# Patient Record
Sex: Female | Born: 1970 | Race: White | Hispanic: Yes | Marital: Married | Smoking: Never smoker
Health system: Southern US, Community
[De-identification: ages and names within clinical notes are randomized; demographics above are authoritative.]

## PROBLEM LIST (undated history)

## (undated) DIAGNOSIS — T7840XA Allergy, unspecified, initial encounter: Secondary | ICD-10-CM

## (undated) DIAGNOSIS — H9313 Tinnitus, bilateral: Secondary | ICD-10-CM

## (undated) DIAGNOSIS — I10 Essential (primary) hypertension: Secondary | ICD-10-CM

## (undated) DIAGNOSIS — K219 Gastro-esophageal reflux disease without esophagitis: Secondary | ICD-10-CM

## (undated) DIAGNOSIS — K429 Umbilical hernia without obstruction or gangrene: Secondary | ICD-10-CM

## (undated) HISTORY — DX: Essential (primary) hypertension: I10

## (undated) HISTORY — DX: Gastro-esophageal reflux disease without esophagitis: K21.9

## (undated) HISTORY — DX: Allergy, unspecified, initial encounter: T78.40XA

---

## 1999-03-05 ENCOUNTER — Ambulatory Visit (HOSPITAL_COMMUNITY): Admission: RE | Admit: 1999-03-05 | Discharge: 1999-03-05 | Payer: Self-pay | Admitting: Neurosurgery

## 1999-03-05 ENCOUNTER — Encounter: Payer: Self-pay | Admitting: Neurosurgery

## 1999-10-15 ENCOUNTER — Other Ambulatory Visit: Admission: RE | Admit: 1999-10-15 | Discharge: 1999-10-15 | Payer: Self-pay | Admitting: Obstetrics and Gynecology

## 2000-11-02 ENCOUNTER — Other Ambulatory Visit: Admission: RE | Admit: 2000-11-02 | Discharge: 2000-11-02 | Payer: Self-pay | Admitting: Obstetrics and Gynecology

## 2001-11-13 ENCOUNTER — Other Ambulatory Visit: Admission: RE | Admit: 2001-11-13 | Discharge: 2001-11-13 | Payer: Self-pay | Admitting: Obstetrics and Gynecology

## 2003-12-27 ENCOUNTER — Other Ambulatory Visit: Admission: RE | Admit: 2003-12-27 | Discharge: 2003-12-27 | Payer: Self-pay | Admitting: Obstetrics and Gynecology

## 2004-01-10 ENCOUNTER — Encounter: Admission: RE | Admit: 2004-01-10 | Discharge: 2004-01-10 | Payer: Self-pay | Admitting: Obstetrics and Gynecology

## 2005-02-02 ENCOUNTER — Other Ambulatory Visit: Admission: RE | Admit: 2005-02-02 | Discharge: 2005-02-02 | Payer: Self-pay | Admitting: Obstetrics and Gynecology

## 2016-01-28 DIAGNOSIS — M47812 Spondylosis without myelopathy or radiculopathy, cervical region: Secondary | ICD-10-CM | POA: Diagnosis not present

## 2016-01-28 DIAGNOSIS — M542 Cervicalgia: Secondary | ICD-10-CM | POA: Diagnosis not present

## 2016-10-28 DIAGNOSIS — N926 Irregular menstruation, unspecified: Secondary | ICD-10-CM | POA: Diagnosis not present

## 2016-10-28 DIAGNOSIS — Z3200 Encounter for pregnancy test, result unknown: Secondary | ICD-10-CM | POA: Diagnosis not present

## 2016-10-28 DIAGNOSIS — N912 Amenorrhea, unspecified: Secondary | ICD-10-CM | POA: Diagnosis not present

## 2016-11-01 DIAGNOSIS — N912 Amenorrhea, unspecified: Secondary | ICD-10-CM | POA: Diagnosis not present

## 2016-11-08 DIAGNOSIS — N911 Secondary amenorrhea: Secondary | ICD-10-CM | POA: Diagnosis not present

## 2016-11-08 DIAGNOSIS — O039 Complete or unspecified spontaneous abortion without complication: Secondary | ICD-10-CM | POA: Diagnosis not present

## 2017-11-17 DIAGNOSIS — N912 Amenorrhea, unspecified: Secondary | ICD-10-CM | POA: Diagnosis not present

## 2017-11-21 DIAGNOSIS — N912 Amenorrhea, unspecified: Secondary | ICD-10-CM | POA: Diagnosis not present

## 2018-09-03 ENCOUNTER — Emergency Department (HOSPITAL_COMMUNITY)
Admission: EM | Admit: 2018-09-03 | Discharge: 2018-09-03 | Discharge status: Against Medical Advice | Payer: 59 | Attending: Emergency Medicine | Admitting: Emergency Medicine

## 2018-09-03 ENCOUNTER — Emergency Department (HOSPITAL_COMMUNITY): Payer: 59

## 2018-09-03 ENCOUNTER — Other Ambulatory Visit: Payer: Self-pay

## 2018-09-03 DIAGNOSIS — M542 Cervicalgia: Secondary | ICD-10-CM | POA: Diagnosis not present

## 2018-09-03 DIAGNOSIS — Z5321 Procedure and treatment not carried out due to patient leaving prior to being seen by health care provider: Secondary | ICD-10-CM | POA: Insufficient documentation

## 2018-09-03 NOTE — ED Notes (Signed)
Patient c-collared in triage

## 2018-09-03 NOTE — ED Triage Notes (Signed)
Patient called and no answer. Unable to locate in the department.

## 2018-09-03 NOTE — ED Triage Notes (Signed)
Patient states that she fell out of bed from a sitting position about 2 hours ago and injured neck. States that pain is getting increasingly worse and she "cannot move" her neck.

## 2019-01-22 DIAGNOSIS — Z03818 Encounter for observation for suspected exposure to other biological agents ruled out: Secondary | ICD-10-CM | POA: Diagnosis not present

## 2019-05-22 DIAGNOSIS — H9312 Tinnitus, left ear: Secondary | ICD-10-CM | POA: Diagnosis not present

## 2019-05-22 DIAGNOSIS — H903 Sensorineural hearing loss, bilateral: Secondary | ICD-10-CM | POA: Diagnosis not present

## 2019-08-01 DIAGNOSIS — J3489 Other specified disorders of nose and nasal sinuses: Secondary | ICD-10-CM | POA: Diagnosis not present

## 2019-08-01 DIAGNOSIS — Z20828 Contact with and (suspected) exposure to other viral communicable diseases: Secondary | ICD-10-CM | POA: Diagnosis not present

## 2019-11-19 ENCOUNTER — Other Ambulatory Visit: Payer: Self-pay | Admitting: General Surgery

## 2019-11-19 DIAGNOSIS — K439 Ventral hernia without obstruction or gangrene: Secondary | ICD-10-CM

## 2019-11-19 DIAGNOSIS — R109 Unspecified abdominal pain: Secondary | ICD-10-CM

## 2019-11-27 ENCOUNTER — Other Ambulatory Visit: Payer: 59

## 2019-11-28 ENCOUNTER — Ambulatory Visit
Admission: RE | Admit: 2019-11-28 | Discharge: 2019-11-28 | Disposition: A | Discharge status: Home / Self Care | Payer: 59 | Source: Ambulatory Visit | Attending: General Surgery | Admitting: General Surgery

## 2019-11-28 DIAGNOSIS — R109 Unspecified abdominal pain: Secondary | ICD-10-CM

## 2019-11-28 DIAGNOSIS — R1011 Right upper quadrant pain: Secondary | ICD-10-CM | POA: Diagnosis not present

## 2019-12-06 ENCOUNTER — Ambulatory Visit
Admission: RE | Admit: 2019-12-06 | Discharge: 2019-12-06 | Disposition: A | Discharge status: Home / Self Care | Payer: 59 | Source: Ambulatory Visit | Attending: General Surgery | Admitting: General Surgery

## 2019-12-06 DIAGNOSIS — K439 Ventral hernia without obstruction or gangrene: Secondary | ICD-10-CM

## 2019-12-06 DIAGNOSIS — R109 Unspecified abdominal pain: Secondary | ICD-10-CM

## 2019-12-06 DIAGNOSIS — K429 Umbilical hernia without obstruction or gangrene: Secondary | ICD-10-CM | POA: Diagnosis not present

## 2020-01-21 ENCOUNTER — Other Ambulatory Visit: Payer: 59

## 2020-04-13 DIAGNOSIS — Z20822 Contact with and (suspected) exposure to covid-19: Secondary | ICD-10-CM | POA: Diagnosis not present

## 2020-04-28 DIAGNOSIS — R109 Unspecified abdominal pain: Secondary | ICD-10-CM | POA: Diagnosis not present

## 2020-04-28 DIAGNOSIS — K429 Umbilical hernia without obstruction or gangrene: Secondary | ICD-10-CM | POA: Diagnosis not present

## 2020-04-29 ENCOUNTER — Other Ambulatory Visit: Payer: Self-pay | Admitting: General Surgery

## 2020-04-29 DIAGNOSIS — R1010 Upper abdominal pain, unspecified: Secondary | ICD-10-CM

## 2020-05-01 ENCOUNTER — Other Ambulatory Visit: Payer: Self-pay

## 2020-05-01 ENCOUNTER — Ambulatory Visit
Admission: RE | Admit: 2020-05-01 | Discharge: 2020-05-01 | Disposition: A | Discharge status: Home / Self Care | Payer: 59 | Source: Ambulatory Visit | Attending: General Surgery | Admitting: General Surgery

## 2020-05-01 DIAGNOSIS — R1900 Intra-abdominal and pelvic swelling, mass and lump, unspecified site: Secondary | ICD-10-CM | POA: Diagnosis not present

## 2020-05-01 DIAGNOSIS — K439 Ventral hernia without obstruction or gangrene: Secondary | ICD-10-CM | POA: Diagnosis not present

## 2020-05-01 DIAGNOSIS — R1010 Upper abdominal pain, unspecified: Secondary | ICD-10-CM

## 2020-05-01 MED ORDER — IOPAMIDOL (ISOVUE-300) INJECTION 61%
100.0000 mL | Freq: Once | INTRAVENOUS | Status: AC | PRN
  Administered 2020-05-01: 100 mL via INTRAVENOUS

## 2020-11-24 ENCOUNTER — Encounter (HOSPITAL_COMMUNITY): Payer: Self-pay

## 2020-11-24 ENCOUNTER — Ambulatory Visit (HOSPITAL_COMMUNITY)
Admission: EM | Admit: 2020-11-24 | Discharge: 2020-11-24 | Disposition: A | Discharge status: Home / Self Care | Payer: No Typology Code available for payment source | Attending: Emergency Medicine | Admitting: Emergency Medicine

## 2020-11-24 ENCOUNTER — Other Ambulatory Visit: Payer: Self-pay

## 2020-11-24 DIAGNOSIS — H8309 Labyrinthitis, unspecified ear: Secondary | ICD-10-CM | POA: Diagnosis not present

## 2020-11-24 HISTORY — DX: Tinnitus, bilateral: H93.13

## 2020-11-24 HISTORY — DX: Umbilical hernia without obstruction or gangrene: K42.9

## 2020-11-24 LAB — POC URINE PREG, ED: Preg Test, Ur: NEGATIVE

## 2020-11-24 MED ORDER — MECLIZINE HCL 12.5 MG PO TABS: 12.5000 mg | ORAL_TABLET | Freq: Three times a day (TID) | ORAL | 0 refills | Status: DC | PRN

## 2020-11-24 MED ORDER — FLUTICASONE PROPIONATE 50 MCG/ACT NA SUSP: 1.0000 | Freq: Every day | NASAL | 2 refills | Status: DC

## 2020-11-24 NOTE — Discharge Instructions (Signed)
Take Meclizine once a night before bed.  Please take one spray of Flonase each side.

## 2020-11-24 NOTE — ED Provider Notes (Signed)
[facility]  ____________________________________________  Time seen: Approximately 4:12 PM  I have reviewed the triage vital signs and the nursing notes.   HISTORY  Chief Complaint Otalgia and Sore   Historian Patient     HPI Alexis Garrett is a 50 y.o. female presents to the urgent care with bilateral ear discomfort, feeling off balance and pharyngitis.  Patient denies fever and chills.  Patient states that she has had occasional tinnitus for the past 2 years.  Patient reports that she has been having hot flashes intermittently over the past 1 to 2 months and that her menstrual cycle is currently 6 days late.  She denies pelvic pain.  Patient is in a monogamous relationship and there is possibility of pregnancy.  She denies recent sick contacts.   Past Medical History:  Diagnosis Date  . Hernia, umbilical   . Tinnitus, bilateral      Immunizations up to date:  Yes.     Past Medical History:  Diagnosis Date  . Hernia, umbilical   . Tinnitus, bilateral     There are no problems to display for this patient.   History reviewed. No pertinent surgical history.  Prior to Admission medications   Medication Sig Start Date End Date Taking? Authorizing Provider  fluticasone (FLONASE) 50 MCG/ACT nasal spray Place 1 spray into both nostrils daily for 7 days. 11/24/20 12/01/20 Yes Vallarie Mare M, PA-C  meclizine (ANTIVERT) 12.5 MG tablet Take 1 tablet (12.5 mg total) by mouth 3 (three) times daily as needed for dizziness. 11/24/20  Yes Vallarie Mare M, PA-C    Allergies Biaxin [clarithromycin] and Clindamycin  History reviewed. No pertinent family history.  Social History Social History   Tobacco Use  . Smoking status: Never Smoker  . Smokeless tobacco: Never Used  Substance Use Topics  . Alcohol use: Yes  . Drug use: Never     Review of Systems  Constitutional: No fever/chills Eyes:  No discharge ENT: Patient has bilateral ear pain.  Respiratory:  no cough. No SOB/ use of accessory muscles to breath Gastrointestinal:   No nausea, no vomiting.  No diarrhea.  No constipation. Musculoskeletal: Negative for musculoskeletal pain. Skin: Negative for rash, abrasions, lacerations, ecchymosis.    ____________________________________________   PHYSICAL EXAM:  VITAL SIGNS: ED Triage Vitals  Enc Vitals Group     BP 11/24/20 1504 (!) 156/98     Pulse Rate 11/24/20 1504 73     Resp 11/24/20 1504 16     Temp 11/24/20 1504 98.5 F (36.9 C)     Temp Source 11/24/20 1504 Oral     SpO2 11/24/20 1504 97 %     Weight --      Height --      Head Circumference --      Peak Flow --      Pain Score 11/24/20 1502 3     Pain Loc --      Pain Edu? --      Excl. in [place]? --      Constitutional: Alert and oriented. Well appearing and in no acute distress. Eyes: Conjunctivae are normal. PERRL. EOMI. Head: Atraumatic. ENT:      Ears: TMs are effused bilaterally.       Nose: No congestion/rhinnorhea.      Mouth/Throat: Mucous membranes are moist.  Patient has cobblestoning. Neck: No stridor.  No cervical spine tenderness to palpation. Cardiovascular: Normal rate, regular rhythm. Normal S1 and S2.  Good peripheral circulation. Respiratory: Normal  respiratory effort without tachypnea or retractions. Lungs CTAB. Good air entry to the bases with no decreased or absent breath sounds Gastrointestinal: Bowel sounds x 4 quadrants. Soft and nontender to palpation. No guarding or rigidity. No distention. Musculoskeletal: Full range of motion to all extremities. No obvious deformities noted Neurologic:  Normal for age. No gross focal neurologic deficits are appreciated.  Skin:  Skin is warm, dry and intact. No rash noted. Psychiatric: Mood and affect are normal for age. Speech and behavior are normal.   ____________________________________________   LABS (all labs ordered are listed, but only abnormal results are displayed)  Labs Reviewed  POC URINE  PREG, ED   ____________________________________________  EKG   ____________________________________________  RADIOLOGY   No results found.  ____________________________________________    PROCEDURES  Procedure(s) performed:     Procedures     Medications - No data to display   ____________________________________________   INITIAL IMPRESSION / ASSESSMENT AND PLAN / ED COURSE  Pertinent labs & imaging results that were available during my care of the patient were reviewed by me and considered in my medical decision making (see chart for details).      Assessment and Plan:  Bilateral ear pain Pharyngitis  50 year old female presents to the urgent care with bilateral ear discomfort, dizziness and pharyngitis for the past several weeks.  Patient was hypertensive at triage but vital signs otherwise reassuring.  Patient's TMs were effused bilaterally.  She had a reassuring neuro exam with no deficits.  Patient was prescribed meclizine and Flonase.  She was referred to primary care as she does not currently have a primary care provider.    ____________________________________________  FINAL CLINICAL IMPRESSION(S) / ED DIAGNOSES  Final diagnoses:  Labyrinthitis, unspecified laterality      NEW MEDICATIONS STARTED DURING THIS VISIT:  ED Discharge Orders         Ordered    meclizine (ANTIVERT) 12.5 MG tablet  3 times daily PRN        11/24/20 1635    fluticasone (FLONASE) 50 MCG/ACT nasal spray  Daily        11/24/20 1635              This chart was dictated using voice recognition software/Dragon. Despite best efforts to proofread, errors can occur which can change the meaning. Any change was purely unintentional.     Lannie Fields, PA-C 11/24/20 1645

## 2020-11-24 NOTE — ED Triage Notes (Signed)
Pt presents with bilateral ear pain x 2 weeks, sore throat x 1 week. States feeling lose of balance when moving the had . Denies fever, chills.   Pt reports tinnitus x 2 years.

## 2021-01-07 ENCOUNTER — Other Ambulatory Visit: Payer: Self-pay

## 2021-01-07 ENCOUNTER — Encounter: Payer: Self-pay | Admitting: Internal Medicine

## 2021-01-07 ENCOUNTER — Ambulatory Visit (INDEPENDENT_AMBULATORY_CARE_PROVIDER_SITE_OTHER): Payer: No Typology Code available for payment source | Admitting: Internal Medicine

## 2021-01-07 VITALS — BP 146/96 | HR 75 | Temp 98.4°F | Resp 18 | Ht 66.0 in | Wt 165.0 lb

## 2021-01-07 DIAGNOSIS — Z0001 Encounter for general adult medical examination with abnormal findings: Secondary | ICD-10-CM

## 2021-01-07 DIAGNOSIS — Z0000 Encounter for general adult medical examination without abnormal findings: Secondary | ICD-10-CM | POA: Diagnosis not present

## 2021-01-07 DIAGNOSIS — Z124 Encounter for screening for malignant neoplasm of cervix: Secondary | ICD-10-CM

## 2021-01-07 DIAGNOSIS — I10 Essential (primary) hypertension: Secondary | ICD-10-CM | POA: Diagnosis not present

## 2021-01-07 DIAGNOSIS — H9313 Tinnitus, bilateral: Secondary | ICD-10-CM | POA: Diagnosis not present

## 2021-01-07 DIAGNOSIS — Z23 Encounter for immunization: Secondary | ICD-10-CM

## 2021-01-07 DIAGNOSIS — Z1211 Encounter for screening for malignant neoplasm of colon: Secondary | ICD-10-CM

## 2021-01-07 DIAGNOSIS — Z1231 Encounter for screening mammogram for malignant neoplasm of breast: Secondary | ICD-10-CM

## 2021-01-07 LAB — HEPATIC FUNCTION PANEL
ALT: 11 U/L (ref 0–35)
AST: 15 U/L (ref 0–37)
Albumin: 4.3 g/dL (ref 3.5–5.2)
Alkaline Phosphatase: 41 U/L (ref 39–117)
Bilirubin, Direct: 0.1 mg/dL (ref 0.0–0.3)
Total Bilirubin: 0.8 mg/dL (ref 0.2–1.2)
Total Protein: 7.3 g/dL (ref 6.0–8.3)

## 2021-01-07 LAB — CBC WITH DIFFERENTIAL/PLATELET
Basophils Absolute: 0.1 10*3/uL (ref 0.0–0.1)
Basophils Relative: 1.1 % (ref 0.0–3.0)
Eosinophils Absolute: 0.3 10*3/uL (ref 0.0–0.7)
Eosinophils Relative: 4.6 % (ref 0.0–5.0)
HCT: 39.6 % (ref 36.0–46.0)
Hemoglobin: 13.3 g/dL (ref 12.0–15.0)
Lymphocytes Relative: 27.2 % (ref 12.0–46.0)
Lymphs Abs: 1.6 10*3/uL (ref 0.7–4.0)
MCHC: 33.6 g/dL (ref 30.0–36.0)
MCV: 93 fl (ref 78.0–100.0)
Monocytes Absolute: 0.4 10*3/uL (ref 0.1–1.0)
Monocytes Relative: 7.1 % (ref 3.0–12.0)
Neutro Abs: 3.5 10*3/uL (ref 1.4–7.7)
Neutrophils Relative %: 60 % (ref 43.0–77.0)
Platelets: 262 10*3/uL (ref 150.0–400.0)
RBC: 4.26 Mil/uL (ref 3.87–5.11)
RDW: 12.7 % (ref 11.5–15.5)
WBC: 5.8 10*3/uL (ref 4.0–10.5)

## 2021-01-07 LAB — URINALYSIS, ROUTINE W REFLEX MICROSCOPIC
Bilirubin Urine: NEGATIVE
Hgb urine dipstick: NEGATIVE
Ketones, ur: NEGATIVE
Leukocytes,Ua: NEGATIVE
Nitrite: NEGATIVE
Specific Gravity, Urine: <=1.005 — AB (ref 1.000–1.030)
Total Protein, Urine: NEGATIVE
Urine Glucose: NEGATIVE
Urobilinogen, UA: 0.2 (ref 0.0–1.0)
pH: 6 (ref 5.0–8.0)

## 2021-01-07 LAB — LIPID PANEL
Cholesterol: 215 mg/dL — ABNORMAL HIGH (ref 0–200)
HDL: 79.1 mg/dL (ref >39.00)
LDL Cholesterol: 127 mg/dL — ABNORMAL HIGH (ref 0–99)
NonHDL: 135.91
Total CHOL/HDL Ratio: 3
Triglycerides: 44 mg/dL (ref 0.0–149.0)
VLDL: 8.8 mg/dL (ref 0.0–40.0)

## 2021-01-07 LAB — BASIC METABOLIC PANEL
BUN: 11 mg/dL (ref 6–23)
CO2: 28 mEq/L (ref 19–32)
Calcium: 9.3 mg/dL (ref 8.4–10.5)
Chloride: 100 mEq/L (ref 96–112)
Creatinine, Ser: 0.61 mg/dL (ref 0.40–1.20)
GFR: 105.52 mL/min (ref >60.00)
Glucose, Bld: 81 mg/dL (ref 70–99)
Potassium: 3.7 mEq/L (ref 3.5–5.1)
Sodium: 136 mEq/L (ref 135–145)

## 2021-01-07 LAB — VITAMIN D 25 HYDROXY (VIT D DEFICIENCY, FRACTURES): VITD: 26.42 ng/mL — ABNORMAL LOW (ref 30.00–100.00)

## 2021-01-07 LAB — TSH: TSH: 2.81 u[IU]/mL (ref 0.35–4.50)

## 2021-01-07 NOTE — Patient Instructions (Signed)

## 2021-01-07 NOTE — Progress Notes (Signed)
Subjective:  Patient ID: Alexis Garrett, female    DOB: [birth year]  Age: 50 y.o. MRN: [id number]  CC: Annual Exam and Hypertension  This visit occurred during the SARS-CoV-2 public health emergency.  Safety protocols were in place, including screening questions prior to the visit, additional usage of staff PPE, and extensive cleaning of exam room while observing appropriate contact time as indicated for disinfecting solutions.    HPI RAMEY KETCHERSIDE presents for a CPX and to establish.  She has a history of hypertension but is not currently taking an antihypertensive.  She is active and denies any recent episodes of chest pain, shortness of breath, palpitations, edema, fatigue.  She complains of chronic tinnitus and would like to see an ENT surgeon.  History [name] has a past medical history of Hernia, umbilical, Hypertension, and Tinnitus, bilateral.   She has no past surgical history on file.   Her family history includes Diabetes in her brother and mother; Hypertension in her father, mother, and sister; Kidney disease in her mother.She reports that she has never smoked. She has never used smokeless tobacco. She reports current alcohol use of about 4.0 standard drinks of alcohol per week. She reports that she does not use drugs.  Outpatient Medications Prior to Visit  Medication Sig Dispense Refill  . meclizine (ANTIVERT) 12.5 MG tablet Take 1 tablet (12.5 mg total) by mouth 3 (three) times daily as needed for dizziness. (Patient not taking: Reported on 01/07/2021) 30 tablet 0  . fluticasone (FLONASE) 50 MCG/ACT nasal spray Place 1 spray into both nostrils daily for 7 days. 9.9 mL 2   No facility-administered medications prior to visit.    ROS Review of Systems  Constitutional: Negative for appetite change, chills, diaphoresis, fatigue and fever.  HENT: Positive for tinnitus.   Respiratory: Negative for apnea, cough, shortness of breath and wheezing.   Cardiovascular: Negative for chest  pain, palpitations and leg swelling.  Gastrointestinal: Negative for abdominal pain, constipation, diarrhea and nausea.  Endocrine: Negative.   Genitourinary: Negative for difficulty urinating and hematuria.  Musculoskeletal: Negative.  Negative for back pain and myalgias.  Skin: Negative.  Negative for color change.  Neurological: Negative.  Negative for dizziness, speech difficulty, weakness and light-headedness.  Hematological: Negative for adenopathy. Does not bruise/bleed easily.  Psychiatric/Behavioral: Negative.     Objective:  BP (!) 146/96 (BP Location: Left Arm, Patient Position: Sitting, Cuff Size: Large) Comment: BP (L) 146/96 (R) 144/92  Pulse 75   Temp 98.4 F (36.9 C) (Oral)   Resp 18   Ht 5' 6"  (1.676 m)   Wt 165 lb (74.8 kg)   LMP 10/21/2020   SpO2 98%   BMI 26.63 kg/m   Physical Exam Vitals reviewed.  Constitutional:      Appearance: Normal appearance.  HENT:     Right Ear: Hearing, tympanic membrane, ear canal and external ear normal. There is impacted cerumen.     Left Ear: Hearing, tympanic membrane, ear canal and external ear normal.     Mouth/Throat:     Mouth: Mucous membranes are moist.     Pharynx: No oropharyngeal exudate.  Eyes:     General: No scleral icterus.    Conjunctiva/sclera: Conjunctivae normal.  Cardiovascular:     Rate and Rhythm: Regular rhythm. Bradycardia present.     Heart sounds: Normal heart sounds, S1 normal and S2 normal. No murmur heard.     Comments: EKG- Sinus bradycardia, 59 bpm No LVH Normal EKG Pulmonary:  Effort: Pulmonary effort is normal.     Breath sounds: No stridor. No wheezing, rhonchi or rales.  Abdominal:     General: Abdomen is flat. Bowel sounds are normal. There is no distension.     Palpations: Abdomen is soft. There is no hepatomegaly, splenomegaly or mass.     Tenderness: There is no abdominal tenderness.  Musculoskeletal:     Right lower leg: No edema.     Left lower leg: No edema.  Skin:     General: Skin is warm and dry.  Neurological:     General: No focal deficit present.     Mental Status: She is alert.  Psychiatric:        Mood and Affect: Mood normal.        Behavior: Behavior normal.     Lab Results  Component Value Date   WBC 5.8 01/07/2021   HGB 13.3 01/07/2021   HCT 39.6 01/07/2021   PLT 262.0 01/07/2021   GLUCOSE 81 01/07/2021   CHOL 215 (H) 01/07/2021   TRIG 44.0 01/07/2021   HDL 79.10 01/07/2021   LDLCALC 127 (H) 01/07/2021   ALT 11 01/07/2021   AST 15 01/07/2021   NA 136 01/07/2021   K 3.7 01/07/2021   CL 100 01/07/2021   CREATININE 0.61 01/07/2021   BUN 11 01/07/2021   CO2 28 01/07/2021   TSH 2.81 01/07/2021    Assessment & Plan:   Aiysha was seen today for annual exam and hypertension.  Diagnoses and all orders for this visit:  Hypertension, unspecified type- Her blood pressure is not adequately well controlled.  I will check her labs to screen for secondary causes and endorgan damage.  Her EKG is reassuring. She is not willing to take an antihypertensive.  She agrees to improve her lifestyle modifications. -     EKG 12-Lead -     CBC with Differential/Platelet; Future -     Basic metabolic panel; Future -     Aldosterone + renin activity w/ ratio; Future -     TSH; Future -     Urinalysis, Routine w reflex microscopic; Future -     VITAMIN D 25 Hydroxy (Vit-D Deficiency, Fractures); Future -     Hepatic function panel; Future -     Hepatic function panel -     VITAMIN D 25 Hydroxy (Vit-D Deficiency, Fractures) -     Urinalysis, Routine w reflex microscopic -     TSH -     Aldosterone + renin activity w/ ratio -     Basic metabolic panel -     CBC with Differential/Platelet  Encounter for general adult medical examination with abnormal findings- Exam completed, labs reviewed, vaccines reviewed and updated, cancer screenings addressed, patient education material was given. -     Lipid panel; Future -     Hepatitis C antibody;  Future -     HIV Antibody (routine testing w rflx); Future -     HIV Antibody (routine testing w rflx) -     Hepatitis C antibody -     Lipid panel  Tinnitus aurium, bilateral -     Ambulatory referral to ENT  Screen for colon cancer -     Cologuard  Cervical cancer screening -     Ambulatory referral to Gynecology  Visit for screening mammogram -     MM DIGITAL SCREENING BILATERAL; Future  Other orders -     Tdap vaccine greater than or equal to  7yo IM   I have discontinued Abreanna Drawdy. Sow "ELENA"'s fluticasone. I am also having her maintain her meclizine.  No orders of the defined types were placed in this encounter.    Follow-up: Return in about 3 months (around 04/09/2021).  Scarlette Calico, MD

## 2021-01-15 ENCOUNTER — Other Ambulatory Visit: Payer: Self-pay | Admitting: Internal Medicine

## 2021-01-15 DIAGNOSIS — E559 Vitamin D deficiency, unspecified: Secondary | ICD-10-CM

## 2021-01-15 LAB — HEPATITIS C ANTIBODY
Hepatitis C Ab: NONREACTIVE
SIGNAL TO CUT-OFF: 0 (ref <1.00)

## 2021-01-15 LAB — ALDOSTERONE + RENIN ACTIVITY W/ RATIO
ALDO / PRA Ratio: 10.3 Ratio (ref 0.9–28.9)
Aldosterone: 4 ng/dL
Renin Activity: 0.39 ng/mL/h (ref 0.25–5.82)

## 2021-01-15 LAB — HIV ANTIBODY (ROUTINE TESTING W REFLEX): HIV 1&2 Ab, 4th Generation: NONREACTIVE

## 2021-01-15 MED ORDER — CHOLECALCIFEROL 50 MCG (2000 UT) PO TABS: 1.0000 | ORAL_TABLET | Freq: Every day | ORAL | 3 refills | Status: AC

## 2021-01-19 ENCOUNTER — Ambulatory Visit: Payer: No Typology Code available for payment source | Admitting: Internal Medicine

## 2021-01-30 ENCOUNTER — Ambulatory Visit: Payer: No Typology Code available for payment source

## 2021-02-03 ENCOUNTER — Ambulatory Visit
Admission: RE | Admit: 2021-02-03 | Discharge: 2021-02-03 | Disposition: A | Discharge status: Home / Self Care | Payer: No Typology Code available for payment source | Source: Ambulatory Visit | Attending: Internal Medicine | Admitting: Internal Medicine

## 2021-02-03 ENCOUNTER — Other Ambulatory Visit: Payer: Self-pay

## 2021-02-03 DIAGNOSIS — Z1231 Encounter for screening mammogram for malignant neoplasm of breast: Secondary | ICD-10-CM

## 2021-02-05 ENCOUNTER — Ambulatory Visit: Payer: No Typology Code available for payment source

## 2021-02-05 LAB — COLOGUARD: Cologuard: NEGATIVE

## 2021-02-06 ENCOUNTER — Other Ambulatory Visit: Payer: Self-pay | Admitting: Internal Medicine

## 2021-02-06 DIAGNOSIS — R928 Other abnormal and inconclusive findings on diagnostic imaging of breast: Secondary | ICD-10-CM

## 2021-02-10 LAB — HM PAP SMEAR

## 2021-02-12 ENCOUNTER — Encounter: Payer: Self-pay | Admitting: Internal Medicine

## 2021-02-27 ENCOUNTER — Other Ambulatory Visit: Payer: Self-pay

## 2021-02-27 ENCOUNTER — Ambulatory Visit: Payer: No Typology Code available for payment source

## 2021-02-27 ENCOUNTER — Ambulatory Visit
Admission: RE | Admit: 2021-02-27 | Discharge: 2021-02-27 | Disposition: A | Discharge status: Home / Self Care | Payer: No Typology Code available for payment source | Source: Ambulatory Visit | Attending: Internal Medicine | Admitting: Internal Medicine

## 2021-02-27 DIAGNOSIS — R928 Other abnormal and inconclusive findings on diagnostic imaging of breast: Secondary | ICD-10-CM

## 2021-09-26 IMAGING — MG MM DIGITAL DIAGNOSTIC UNILAT*L* W/ TOMO W/ CAD
4 series · 4 of 12 positions shown · non-contrast
Comparison: Previous exam(s).

CLINICAL DATA: Patient returns after new baseline screening study
for evaluation of possible LEFT breast masses.

EXAM:
DIGITAL DIAGNOSTIC UNILATERAL LEFT MAMMOGRAM WITH TOMOSYNTHESIS AND
CAD
TECHNIQUE: Left digital diagnostic mammography and breast tomosynthesis was
performed. The images were evaluated with computer-aided detection.

[L CC synth-2D]
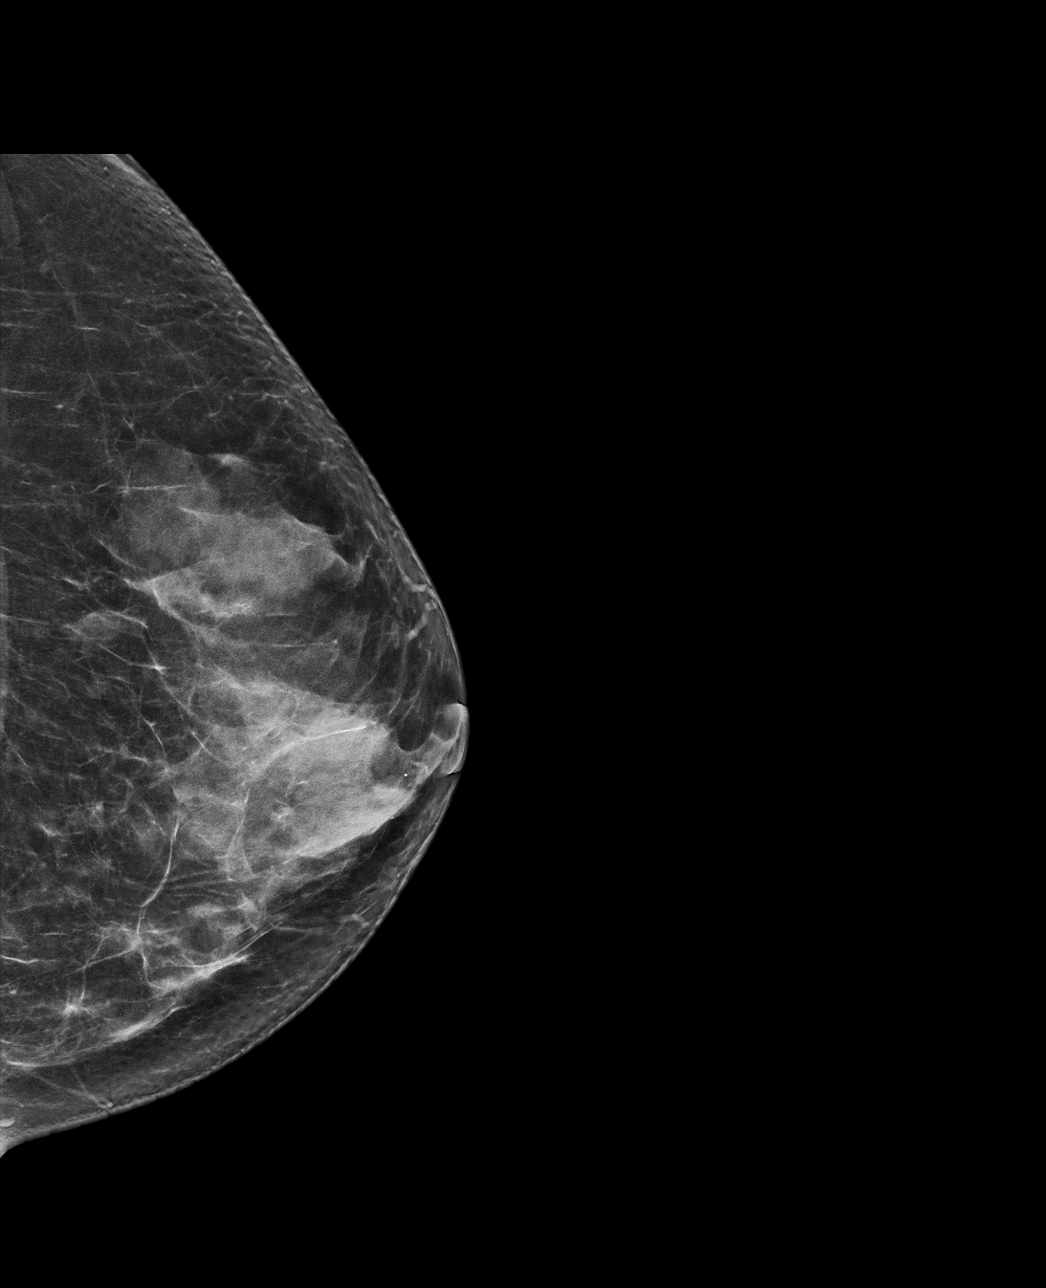

[L MLO synth-2D]
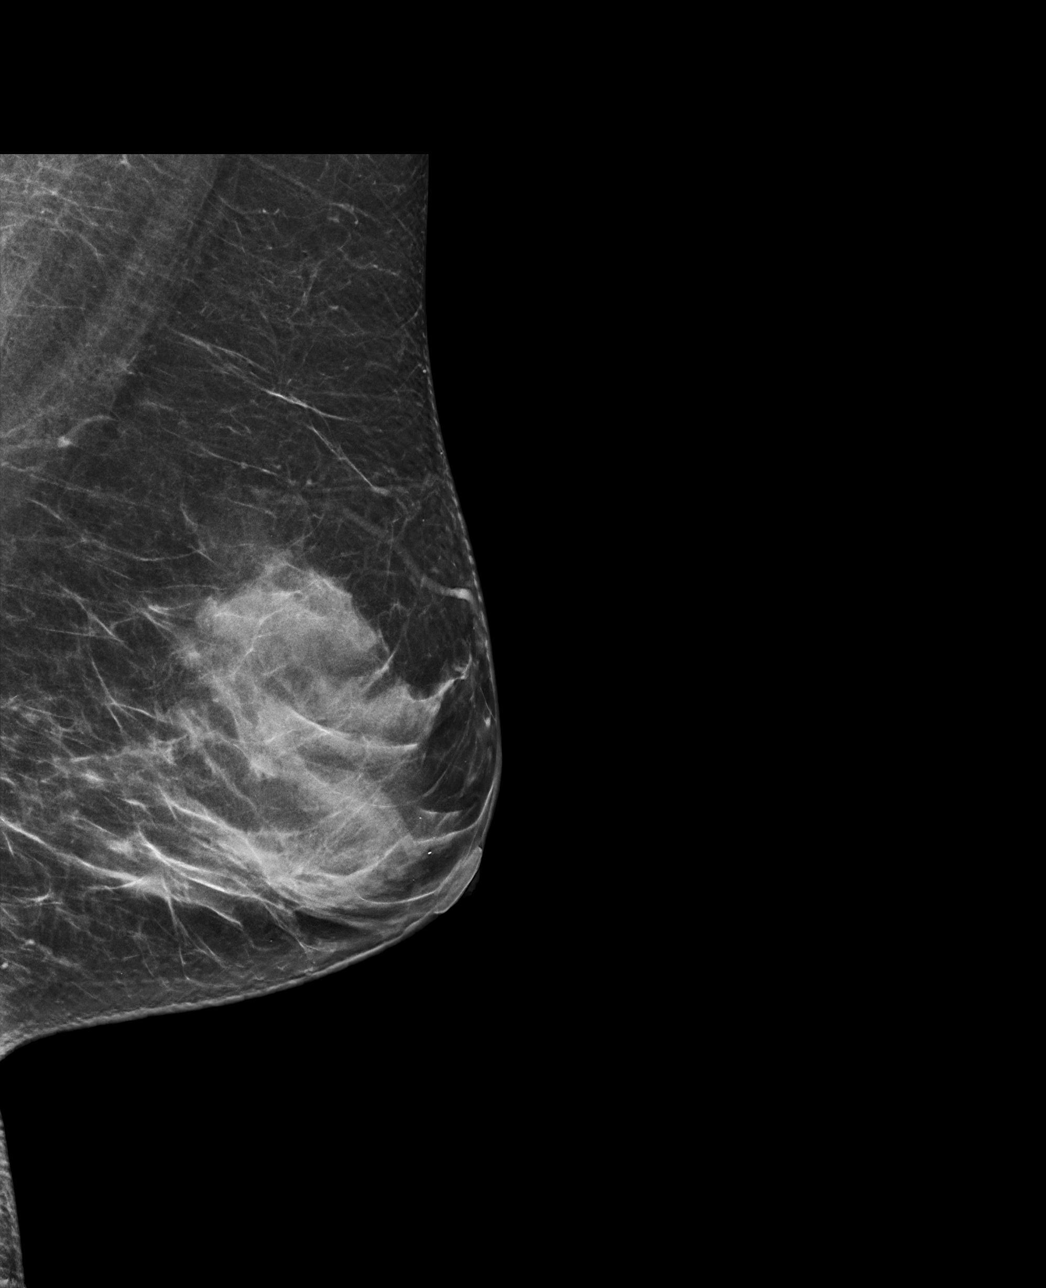

[L CC tomo · tomo slice 37/74.0]
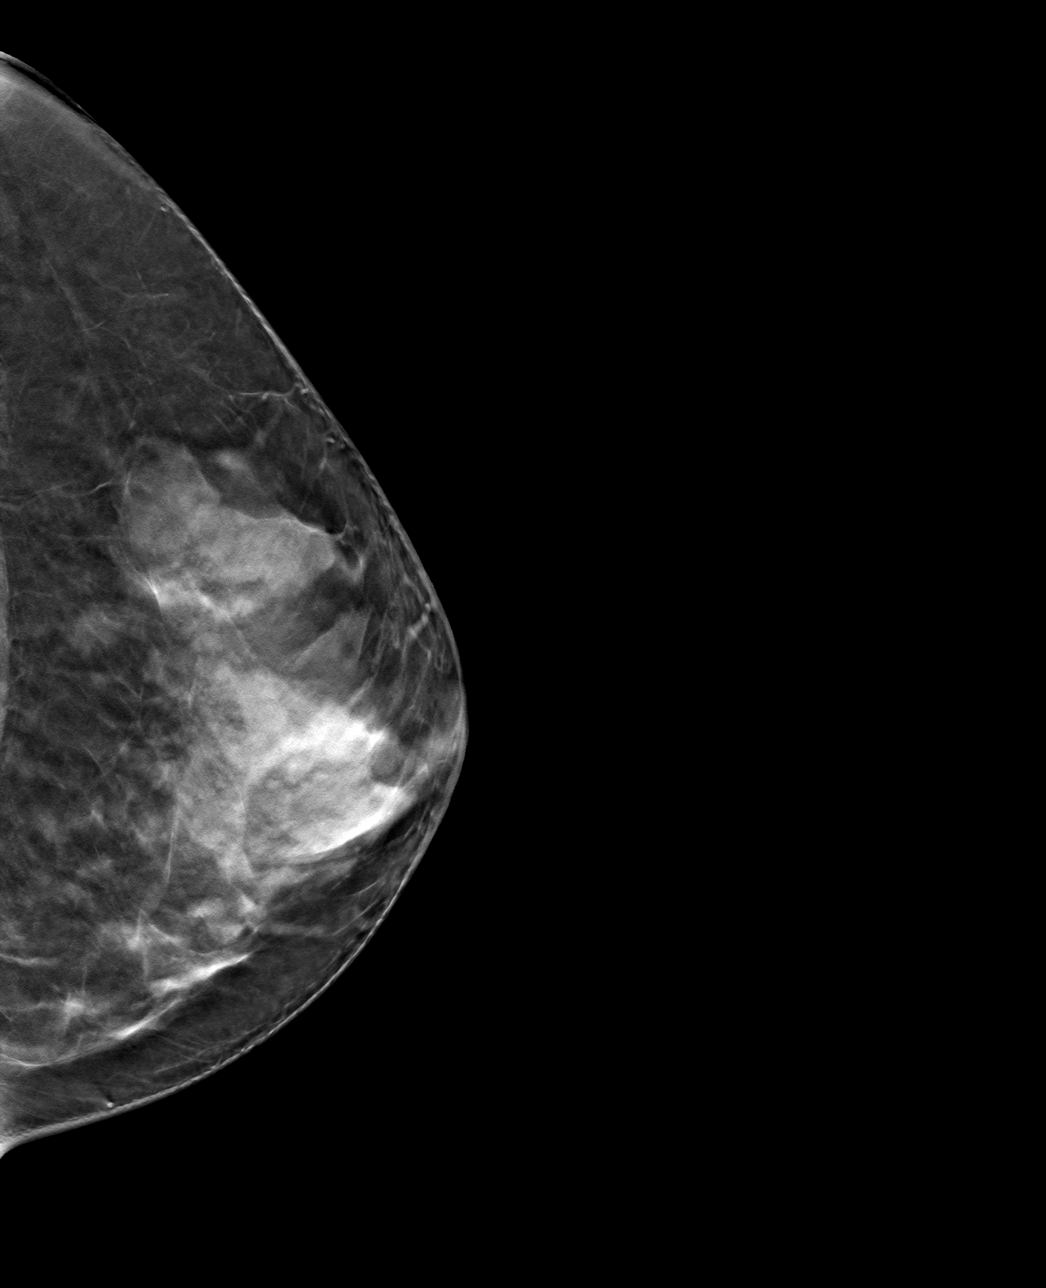

[L MLO tomo · tomo slice 36/71.0]
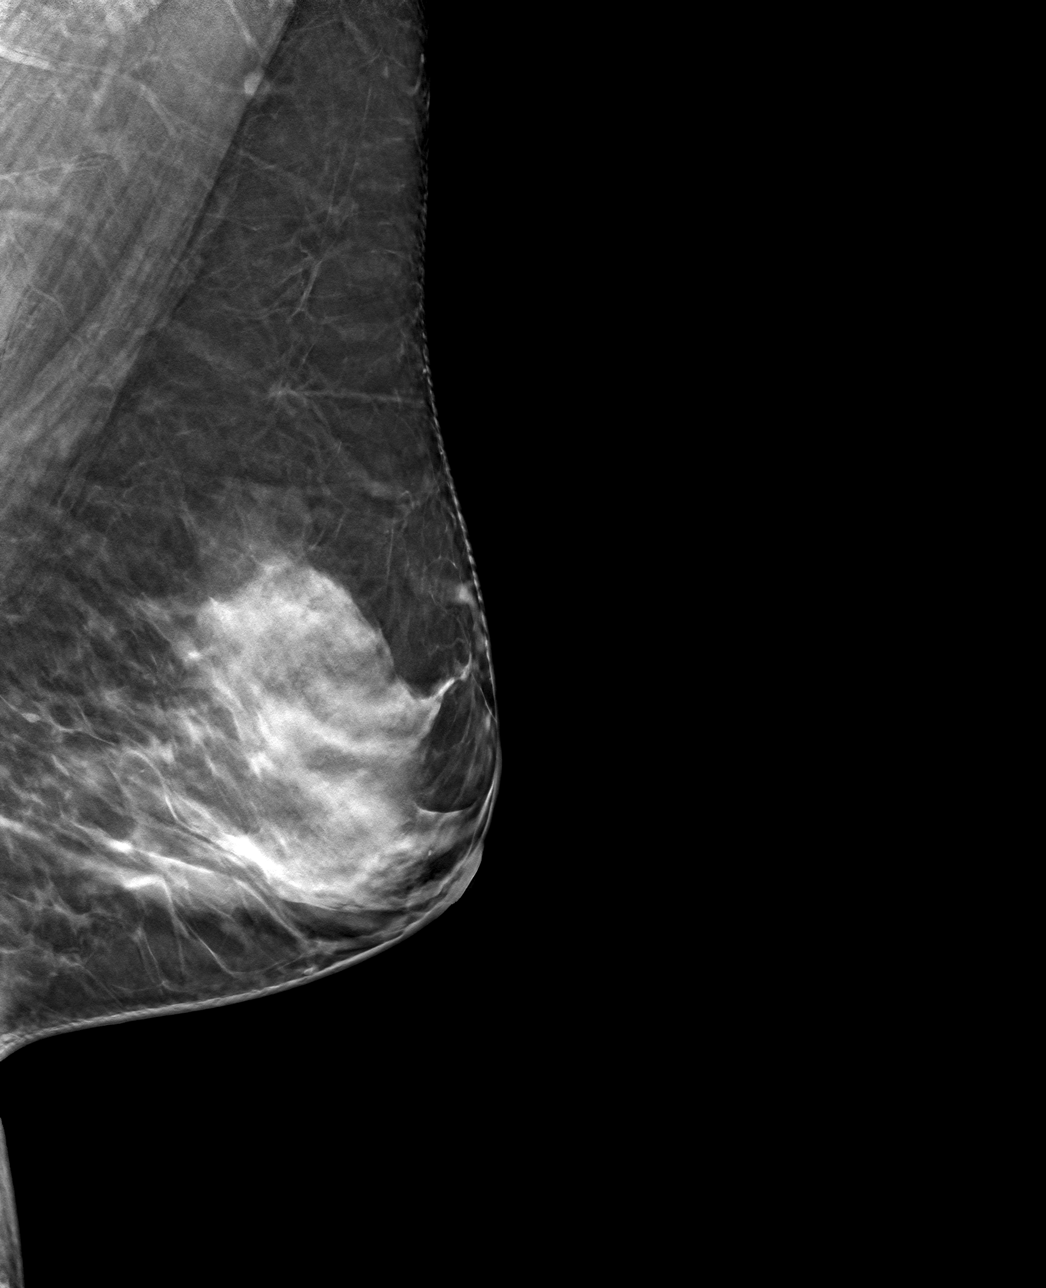

[4 of 12 positions shown; findings below may reference images not displayed]

ACR Breast Density Category c: The breast tissue is heterogeneously
dense, which may obscure small masses.
FINDINGS: Additional 2-D and 3-D images are performed. These views show no
persistent masses in the LEFT breast.
IMPRESSION: No mammographic evidence for malignancy.

RECOMMENDATION:
Screening mammogram in one year.(Code:[id number]-TZS)

I have discussed the findings and recommendations with the patient.
If applicable, a reminder letter will be sent to the patient
regarding the next appointment.

BI-RADS CATEGORY  1: Negative.

## 2022-01-28 DIAGNOSIS — R21 Rash and other nonspecific skin eruption: Secondary | ICD-10-CM | POA: Diagnosis not present

## 2022-01-28 DIAGNOSIS — J3089 Other allergic rhinitis: Secondary | ICD-10-CM | POA: Diagnosis not present

## 2022-03-16 DIAGNOSIS — B399 Histoplasmosis, unspecified: Secondary | ICD-10-CM | POA: Diagnosis not present

## 2023-04-09 DIAGNOSIS — Z111 Encounter for screening for respiratory tuberculosis: Secondary | ICD-10-CM | POA: Diagnosis not present

## 2023-04-11 DIAGNOSIS — Z111 Encounter for screening for respiratory tuberculosis: Secondary | ICD-10-CM | POA: Diagnosis not present

## 2023-10-12 ENCOUNTER — Encounter: Payer: Self-pay | Admitting: Gastroenterology

## 2023-11-11 ENCOUNTER — Telehealth: Payer: Self-pay | Admitting: Gastroenterology

## 2023-11-11 NOTE — Telephone Encounter (Signed)
 Unable to reach patient to schedule colonoscopy for the end of May. Left voicemail.

## 2023-11-16 ENCOUNTER — Encounter: Payer: Self-pay | Admitting: Family Medicine

## 2023-11-16 ENCOUNTER — Ambulatory Visit: Payer: 59 | Admitting: Family Medicine

## 2023-11-16 VITALS — BP 120/76 | HR 82 | Temp 98.6°F | Ht 66.0 in | Wt 171.0 lb

## 2023-11-16 DIAGNOSIS — Z0001 Encounter for general adult medical examination with abnormal findings: Secondary | ICD-10-CM

## 2023-11-16 DIAGNOSIS — Z13220 Encounter for screening for lipoid disorders: Secondary | ICD-10-CM

## 2023-11-16 DIAGNOSIS — Z1329 Encounter for screening for other suspected endocrine disorder: Secondary | ICD-10-CM

## 2023-11-16 DIAGNOSIS — Z1211 Encounter for screening for malignant neoplasm of colon: Secondary | ICD-10-CM

## 2023-11-16 DIAGNOSIS — E559 Vitamin D deficiency, unspecified: Secondary | ICD-10-CM

## 2023-11-16 DIAGNOSIS — T781XXA Other adverse food reactions, not elsewhere classified, initial encounter: Secondary | ICD-10-CM | POA: Insufficient documentation

## 2023-11-16 DIAGNOSIS — Z124 Encounter for screening for malignant neoplasm of cervix: Secondary | ICD-10-CM

## 2023-11-16 DIAGNOSIS — Z0000 Encounter for general adult medical examination without abnormal findings: Secondary | ICD-10-CM | POA: Insufficient documentation

## 2023-11-16 DIAGNOSIS — Z1231 Encounter for screening mammogram for malignant neoplasm of breast: Secondary | ICD-10-CM

## 2023-11-16 NOTE — Progress Notes (Signed)
 New Patient Office Visit  Subjective    Patient ID: Alexis Garrett, female    DOB: 11-Nov-1970  Age: 53 y.o. MRN: [id number]  CC:  Chief Complaint  Patient presents with   Annual Exam    Pt would like pap today and would like to discuss scheduling colonoscopy.    HPI Alexis Garrett presents to establish care. Oriented to practice routines and expectations. PMH includes umbilical hernia. Concerns include hair loss and would like labs drawn today. She does endorse tinnitus in left ear, sees an opthamologist. She does see a dentist regularly. She does not have regular exercise routine due to schedule conflicts with baby goat, previously using treadmill at home and uses this. Eats a regular diet  Other concerns include diarrhea and upset stomach after consuming red meat. She has been monitoring these symptoms and would like alpha gal testing. She has had tick bites in the past. Denies melena or hematochezia, nausea, or vomiting.  Breast CA screening: Mammogram status: Completed 02/03/2021. Repeat every year Cervical CA screening: approximate date 02/10/2021 and was normal Colon CA screening: previous cologuard negative, would like colonoscopy Tobacco: non-smoker ETOH: socially Vaccines:  UTD     Outpatient Encounter Medications as of 11/16/2023  Medication Sig   Cholecalciferol 50 MCG (2000 UT) TABS Take 1 tablet (2,000 Units total) by mouth daily.   Cyanocobalamin (B-12) 1000 MCG CAPS    Multiple Vitamins-Minerals (QC WOMENS DAILY MULTIVITAMIN) TABS    cetirizine (ZYRTEC ALLERGY) 10 MG tablet 1 tablet Orally Once a day for 30 day(s) (Patient not taking: Reported on 11/16/2023)   No facility-administered encounter medications on file as of 11/16/2023.    Past Medical History:  Diagnosis Date   Hernia, umbilical    Hypertension    Tinnitus, bilateral     History reviewed. No pertinent surgical history.  Family History  Problem Relation Age of Onset   Diabetes Mother     Hypertension Mother    Kidney disease Mother    Hypertension Father    Hypertension Sister    Diabetes Brother     Social History   Socioeconomic History   Marital status: Married    Spouse name: Not on file   Number of children: Not on file   Years of education: Not on file   Highest education level: Master's degree (e.g., MA, MS, MEng, MEd, MSW, MBA)  Occupational History   Not on file  Tobacco Use   Smoking status: Never   Smokeless tobacco: Never  Substance and Sexual Activity   Alcohol use: Yes    Alcohol/week: 4.0 standard drinks of alcohol    Types: 4 Glasses of wine per week   Drug use: Never   Sexual activity: Yes    Partners: Male  Other Topics Concern   Not on file  Social History Narrative   Not on file   Social Drivers of Health   Financial Resource Strain: Low Risk  (11/14/2023)   Overall Financial Resource Strain (CARDIA)    Difficulty of Paying Living Expenses: Not hard at all  Food Insecurity: No Food Insecurity (11/14/2023)   Hunger Vital Sign    Worried About Running Out of Food in the Last Year: Never true    Ran Out of Food in the Last Year: Never true  Transportation Needs: No Transportation Needs (11/14/2023)   PRAPARE - Administrator, Civil Service (Medical): No    Lack of Transportation (Non-Medical): No  Physical Activity: Insufficiently Active (  11/14/2023)   Exercise Vital Sign    Days of Exercise per Week: 2 days    Minutes of Exercise per Session: 40 min  Stress: No Stress Concern Present (11/14/2023)   [employer] of Occupational Health - Occupational Stress Questionnaire    Feeling of Stress : Not at all  Social Connections: Moderately Integrated (11/14/2023)   Social Connection and Isolation Panel [NHANES]    Frequency of Communication with Friends and Family: More than three times a week    Frequency of Social Gatherings with Friends and Family: Twice a week    Attends Religious Services: More than 4 times per year     Active Member of [employer] or Organizations: No    Attends Engineer, structural: Not on file    Marital Status: Married  Catering manager Violence: Not on file    Review of Systems  Constitutional: Negative.   HENT:  Positive for tinnitus.   Eyes: Negative.   Respiratory: Negative.    Cardiovascular: Negative.   Gastrointestinal: Negative.   Genitourinary: Negative.   Musculoskeletal: Negative.   Skin: Negative.   Neurological: Negative.   Endo/Heme/Allergies: Negative.   Psychiatric/Behavioral: Negative.    All other systems reviewed and are negative.       Objective    BP 120/76   Pulse 82   Temp 98.6 F (37 C)   Ht 5\' 6"  (1.676 m)   Wt 171 lb (77.6 kg)   LMP 04/21/2023 (Approximate)   SpO2 98%   BMI 27.60 kg/m   Physical Exam Vitals and nursing note reviewed.  Constitutional:      Appearance: Normal appearance. She is normal weight.  HENT:     Head: Normocephalic and atraumatic.     Right Ear: Tympanic membrane, ear canal and external ear normal.     Left Ear: Tympanic membrane, ear canal and external ear normal.     Nose: Nose normal.     Mouth/Throat:     Mouth: Mucous membranes are moist.     Pharynx: Oropharynx is clear.  Eyes:     Extraocular Movements: Extraocular movements intact.     Conjunctiva/sclera: Conjunctivae normal.     Pupils: Pupils are equal, round, and reactive to light.  Cardiovascular:     Rate and Rhythm: Normal rate and regular rhythm.     Pulses: Normal pulses.     Heart sounds: Normal heart sounds.  Pulmonary:     Effort: Pulmonary effort is normal.     Breath sounds: Normal breath sounds.  Abdominal:     General: Bowel sounds are normal.     Palpations: Abdomen is soft.  Musculoskeletal:        General: Normal range of motion.     Cervical back: Normal range of motion and neck supple.  Skin:    General: Skin is warm and dry.     Capillary Refill: Capillary refill takes less than 2 seconds.  Neurological:      General: No focal deficit present.     Mental Status: She is alert and oriented to person, place, and time. Mental status is at baseline.  Psychiatric:        Mood and Affect: Mood normal.        Behavior: Behavior normal.        Thought Content: Thought content normal.        Judgment: Judgment normal.   BREAST EXAM: breasts appear normal, no suspicious masses, no skin or nipple changes or axillary  nodes  PELVIC EXAM: normal external genitalia, vulva, vagina, cervix, uterus and adnexa, PAP: Pap smear done today, exam chaperoned by Germaine Pomf[facility] [employer]      Assessment & Plan:   Problem List Items Addressed This Visit     Cervical cancer screening   Routine pap. Proceed as indicated by results.       Relevant Orders   Pap, TP Imaging w/ CT/GC and w/ HPV RNA, rflx HPV Type 16/18   Physical exam, annual - Primary   Today your medical history was reviewed and routine physical exam with labs was performed. Recommend 150 minutes of moderate intensity exercise weekly and consuming a well-balanced diet. Advised to stop smoking if a smoker, avoid smoking if a non-smoker, limit alcohol consumption to 1 drink per day for women and 2 drinks per day for men, and avoid illicit drug use. Counseled on safe sex practices and offered STI testing today. Counseled on the importance of sunscreen use. Counseled in mental health awareness and when to seek medical care. Vaccine maintenance discussed. Appropriate health maintenance items reviewed. Return to office in 1 year for annual physical exam.       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   Relevant Orders   Alpha-Gal Panel   Other Visit Diagnoses        Encounter for screening mammogram for malignant neoplasm of breast       Relevant Orders   MM DIGITAL SCREENING BILATERAL     Colon cancer screening       Relevant Orders   Ambulatory referral to Gastroenterology     Screening for lipoid disorders       Relevant Orders   Lipid panel     Screening for thyroid disorder       Relevant Orders   TSH     Vitamin D deficiency       Relevant Orders   VITAMIN D 25 Hydroxy (Vit-D Deficiency, Fractures)       Return in about 1 year (around 11/15/2024) for annual physical with labs 1 week prior.   Park Meo, FNP

## 2023-11-16 NOTE — Assessment & Plan Note (Signed)
 Routine pap. Proceed as indicated by results.

## 2023-11-16 NOTE — Assessment & Plan Note (Signed)

## 2023-11-19 LAB — PAP, TP IMAGING W/ HPV RNA, RFLX HPV TYPE 16,18/45: HPV DNA High Risk: NOT DETECTED

## 2023-11-19 LAB — PAP, TP IMAGING W/ CT/GC AND W/ HPV RNA, RFLX HPV TYPE 16/18

## 2023-11-19 LAB — C. TRACHOMATIS/N. GONORRHOEAE RNA
C. trachomatis RNA, TMA: NOT DETECTED
N. gonorrhoeae RNA, TMA: NOT DETECTED

## 2023-11-20 LAB — CBC WITH DIFFERENTIAL/PLATELET
Absolute Lymphocytes: 1717 {cells}/uL (ref 850–3900)
Absolute Monocytes: 342 {cells}/uL (ref 200–950)
Basophils Absolute: 59 {cells}/uL (ref 0–200)
Basophils Relative: 1 %
Eosinophils Absolute: 271 {cells}/uL (ref 15–500)
Eosinophils Relative: 4.6 %
HCT: 43.7 % (ref 35.0–45.0)
Hemoglobin: 14.6 g/dL (ref 11.7–15.5)
MCH: 31.3 pg (ref 27.0–33.0)
MCHC: 33.4 g/dL (ref 32.0–36.0)
MCV: 93.6 fL (ref 80.0–100.0)
MPV: 11 fL (ref 7.5–12.5)
Monocytes Relative: 5.8 %
Neutro Abs: 3511 {cells}/uL (ref 1500–7800)
Neutrophils Relative %: 59.5 %
Platelets: 320 10*3/uL (ref 140–400)
RBC: 4.67 10*6/uL (ref 3.80–5.10)
RDW: 11.7 % (ref 11.0–15.0)
Total Lymphocyte: 29.1 %
WBC: 5.9 10*3/uL (ref 3.8–10.8)

## 2023-11-20 LAB — COMPLETE METABOLIC PANEL WITH GFR
AG Ratio: 1.4 (calc) (ref 1.0–2.5)
ALT: 16 U/L (ref 6–29)
AST: 19 U/L (ref 10–35)
Albumin: 4.7 g/dL (ref 3.6–5.1)
Alkaline phosphatase (APISO): 56 U/L (ref 37–153)
BUN: 18 mg/dL (ref 7–25)
CO2: 30 mmol/L (ref 20–32)
Calcium: 10.1 mg/dL (ref 8.6–10.4)
Chloride: 99 mmol/L (ref 98–110)
Creat: 0.71 mg/dL (ref 0.50–1.03)
Globulin: 3.3 g/dL (ref 1.9–3.7)
Glucose, Bld: 87 mg/dL (ref 65–99)
Potassium: 4.1 mmol/L (ref 3.5–5.3)
Sodium: 139 mmol/L (ref 135–146)
Total Bilirubin: 0.6 mg/dL (ref 0.2–1.2)
Total Protein: 8 g/dL (ref 6.1–8.1)
eGFR: 103 mL/min/{1.73_m2} (ref >=60)

## 2023-11-20 LAB — TSH: TSH: 2.17 m[IU]/L

## 2023-11-20 LAB — ALPHA-GAL PANEL
Allergen, Mutton, f88: <0.1 kU/L
Allergen, Pork, f26: <0.1 kU/L
Beef: <0.1 kU/L
CLASS: 0
CLASS: 0
Class: 0
GALACTOSE-ALPHA-1,3-GALACTOSE IGE*: <0.1 kU/L (ref <0.10)

## 2023-11-20 LAB — TEST AUTHORIZATION

## 2023-11-20 LAB — IRON,TIBC AND FERRITIN PANEL
%SAT: 34 % (ref 16–45)
Ferritin: 84 ng/mL (ref 16–232)
Iron: 122 ug/dL (ref 45–160)
TIBC: 356 ug/dL (ref 250–450)

## 2023-11-20 LAB — LIPID PANEL
Cholesterol: 284 mg/dL — ABNORMAL HIGH (ref <200)
HDL: 95 mg/dL (ref >=50)
LDL Cholesterol (Calc): 173 mg/dL — ABNORMAL HIGH
Non-HDL Cholesterol (Calc): 189 mg/dL — ABNORMAL HIGH (ref <130)
Total CHOL/HDL Ratio: 3 (calc) (ref <5.0)
Triglycerides: 65 mg/dL (ref <150)

## 2023-11-20 LAB — VITAMIN D 25 HYDROXY (VIT D DEFICIENCY, FRACTURES): Vit D, 25-Hydroxy: 28 ng/mL — ABNORMAL LOW (ref 30–100)

## 2023-11-20 LAB — INTERPRETATION:

## 2023-12-01 ENCOUNTER — Encounter: Payer: Self-pay | Admitting: Family Medicine

## 2023-12-02 ENCOUNTER — Encounter: Payer: 59 | Admitting: Gastroenterology

## 2023-12-09 ENCOUNTER — Ambulatory Visit
Admission: RE | Admit: 2023-12-09 | Discharge: 2023-12-09 | Disposition: A | Discharge status: Home / Self Care | Source: Ambulatory Visit | Attending: Family Medicine | Admitting: Family Medicine

## 2023-12-09 ENCOUNTER — Ambulatory Visit (AMBULATORY_SURGERY_CENTER)

## 2023-12-09 ENCOUNTER — Ambulatory Visit

## 2023-12-09 VITALS — Ht 66.0 in | Wt 169.0 lb

## 2023-12-09 DIAGNOSIS — Z0000 Encounter for general adult medical examination without abnormal findings: Secondary | ICD-10-CM

## 2023-12-09 DIAGNOSIS — Z1211 Encounter for screening for malignant neoplasm of colon: Secondary | ICD-10-CM

## 2023-12-09 DIAGNOSIS — Z1231 Encounter for screening mammogram for malignant neoplasm of breast: Secondary | ICD-10-CM

## 2023-12-09 MED ORDER — SUFLAVE 178.7 G PO SOLR: 1.0000 | Freq: Once | ORAL | 0 refills | Status: AC

## 2023-12-09 NOTE — Progress Notes (Signed)
 No egg or soy allergy known to patient  No issues known to pt with past sedation with any surgeries or procedures Patient denies ever being told they had issues or difficulty with intubation  No FH of Malignant Hyperthermia Pt is not on diet pills Pt is not on  home 02  Pt is not on blood thinners  Pt denies issues with constipation  No A fib or A flutter Have any cardiac testing pending--no Pt can ambulate independently Pt denies use of chewing tobacco Discussed diabetic I weight loss medication holds Discussed NSAID holds Checked BMI Pt instructed to use Single[employer] or GoodRx for a price reduction on prep  Patient's chart reviewed by Alexis Garrett CNRA prior to previsit and patient appropriate for the [facility].  Pre visit completed and red dot placed by patient's name on their procedure day (on provider's schedule).

## 2023-12-27 ENCOUNTER — Encounter: Payer: Self-pay | Admitting: Certified Registered Nurse Anesthetist

## 2023-12-28 ENCOUNTER — Telehealth: Payer: Self-pay | Admitting: Gastroenterology

## 2023-12-28 NOTE — Telephone Encounter (Signed)
 Alexis Garrett can you help clarify what happened here, I don't understand.   This patient should have never been scheduled in the first place for a procedure if her insurance wasn't active. I would not reschedule her again until her insurance is active. Did the patient just call to cancel? If so unfortunately there will be a late charge given the late notice for cancellation, there is not a way to fill the slot for tomorrow unfortunately.

## 2023-12-28 NOTE — Telephone Encounter (Signed)
 Good afternoon Dr. General Kenner,  Patients procedure was initially scheduled for 12/30/23 due to insurance not being active at this time, I rescheduled her procedure to the next available date 613/25. She stated she just made a payment in which is still pending.  Thanks

## 2023-12-28 NOTE — Telephone Encounter (Signed)
 Understood, thanks for clarifying. So I am assuming her insurance will be active again if she is rescheduled and made a payment. Thank you

## 2023-12-30 ENCOUNTER — Encounter: Admitting: Gastroenterology

## 2024-02-08 ENCOUNTER — Encounter: Payer: Self-pay | Admitting: Gastroenterology

## 2024-02-17 ENCOUNTER — Ambulatory Visit (AMBULATORY_SURGERY_CENTER): Admitting: Gastroenterology

## 2024-02-17 ENCOUNTER — Encounter: Payer: Self-pay | Admitting: Gastroenterology

## 2024-02-17 VITALS — BP 143/95 | HR 72 | Temp 98.0°F | Resp 11 | Ht 66.0 in | Wt 159.0 lb

## 2024-02-17 DIAGNOSIS — D127 Benign neoplasm of rectosigmoid junction: Secondary | ICD-10-CM

## 2024-02-17 DIAGNOSIS — K648 Other hemorrhoids: Secondary | ICD-10-CM

## 2024-02-17 DIAGNOSIS — Z1211 Encounter for screening for malignant neoplasm of colon: Secondary | ICD-10-CM

## 2024-02-17 DIAGNOSIS — K5730 Diverticulosis of large intestine without perforation or abscess without bleeding: Secondary | ICD-10-CM

## 2024-02-17 DIAGNOSIS — K621 Rectal polyp: Secondary | ICD-10-CM | POA: Diagnosis not present

## 2024-02-17 DIAGNOSIS — K635 Polyp of colon: Secondary | ICD-10-CM

## 2024-02-17 MED ORDER — SODIUM CHLORIDE 0.9 % IV SOLN: 500.0000 mL | Freq: Once | INTRAVENOUS | Status: DC

## 2024-02-17 NOTE — Op Note (Signed)
 [facility] Patient Name: Alexis Garrett Procedure Date: 02/17/2024 1:43 PM MRN: [id number] Endoscopist: Landon Pinion P. General Kenner , MD, [id number] Age: 53 Referring MD:  Date of Birth: 07-15-1971 Gender: Female Account #: [id number] Procedure:                Colonoscopy Indications:              Screening for colorectal malignant neoplasm, This                            is the patient's first colonoscopy Medicines:                Monitored Anesthesia Care Procedure:                Pre-Anesthesia Assessment:                           - Prior to the procedure, a History and Physical                            was performed, and patient medications and                            allergies were reviewed. The patient's tolerance of                            previous anesthesia was also reviewed. The risks                            and benefits of the procedure and the sedation                            options and risks were discussed with the patient.                            All questions were answered, and informed consent                            was obtained. Prior Anticoagulants: The patient has                            taken no anticoagulant or antiplatelet agents. ASA                            Grade Assessment: II - A patient with mild systemic                            disease. After reviewing the risks and benefits,                            the patient was deemed in satisfactory condition to                            undergo the procedure.  After obtaining informed consent, the colonoscope                            was passed under direct vision. Throughout the                            procedure, the patient's blood pressure, pulse, and                            oxygen saturations were monitored continuously. The                            [id number]-HQ190L Colonoscope [id number] was introduced                            through the anus and  advanced to the the cecum,                            identified by appendiceal orifice and ileocecal                            valve. The colonoscopy was performed without                            difficulty. The patient tolerated the procedure                            well. The quality of the bowel preparation was                            good. The ileocecal valve, appendiceal orifice, and                            rectum were photographed. Scope In: 1:54:52 PM Scope Out: 2:13:03 PM Scope Withdrawal Time: 0 hours 15 minutes 20 seconds  Total Procedure Duration: 0 hours 18 minutes 11 seconds  Findings:                 The perianal and digital rectal examinations were                            normal.                           A 3 to 4 mm polyp was found in the recto-sigmoid                            colon. The polyp was sessile. The polyp was removed                            with a cold snare. Resection and retrieval were                            complete.  A few small-mouthed diverticula were found in the                            sigmoid colon.                           Internal hemorrhoids were found during                            retroflexion. The hemorrhoids were small.                           The exam was otherwise without abnormality. Complications:            No immediate complications. Estimated blood loss:                            Minimal. Estimated Blood Loss:     Estimated blood loss was minimal. Impression:               - One 3 to 4 mm polyp at the recto-sigmoid colon,                            removed with a cold snare. Resected and retrieved.                           - Diverticulosis in the sigmoid colon.                           - Internal hemorrhoids.                           - The examination was otherwise normal. Recommendation:           - Patient has a contact number available for                             emergencies. The signs and symptoms of potential                            delayed complications were discussed with the                            patient. Return to normal activities tomorrow.                            Written discharge instructions were provided to the                            patient.                           - Resume previous diet.                           - Continue present medications.                           -  Await pathology results. Landon Pinion P. Lee-Anne Flicker, MD 02/17/2024 2:17:26 PM This report has been signed electronically.

## 2024-02-17 NOTE — Patient Instructions (Signed)
 Resume previous diet and medications. Awaiting pathology results, repeat Colonoscopy date to be determined based on pathology results. Handout provided on colon polyps.  YOU HAD AN ENDOSCOPIC PROCEDURE TODAY AT THE [facility]:   Refer to the procedure report that was given to you for any specific questions about what was found during the examination.  If the procedure report does not answer your questions, please call your gastroenterologist to clarify.  If you requested that your care partner not be given the details of your procedure findings, then the procedure report has been included in a sealed envelope for you to review at your convenience later.  YOU SHOULD EXPECT: Some feelings of bloating in the abdomen. Passage of more gas than usual.  Walking can help get rid of the air that was put into your GI tract during the procedure and reduce the bloating. If you had a lower endoscopy (such as a colonoscopy or flexible sigmoidoscopy) you may notice spotting of blood in your stool or on the toilet paper. If you underwent a bowel prep for your procedure, you may not have a normal bowel movement for a few days.  Please Note:  You might notice some irritation and congestion in your nose or some drainage.  This is from the oxygen used during your procedure.  There is no need for concern and it should clear up in a day or so.  SYMPTOMS TO REPORT IMMEDIATELY:  Following lower endoscopy (colonoscopy or flexible sigmoidoscopy):  Excessive amounts of blood in the stool  Significant tenderness or worsening of abdominal pains  Swelling of the abdomen that is new, acute  Fever of 100F or higher   For urgent or emergent issues, a gastroenterologist can be reached at any hour by calling [phone]. Do not use MyChart messaging for urgent concerns.    DIET:  We do recommend a small meal at first, but then you may proceed to your regular diet.  Drink plenty of fluids but you should avoid  alcoholic beverages for 24 hours.  ACTIVITY:  You should plan to take it easy for the rest of today and you should NOT DRIVE or use heavy machinery until tomorrow (because of the sedation medicines used during the test).    FOLLOW UP: Our staff will call the number listed on your records the next business day following your procedure.  We will call around 7:15- 8:00 am to check on you and address any questions or concerns that you may have regarding the information given to you following your procedure. If we do not reach you, we will leave a message.     If any biopsies were taken you will be contacted by phone or by letter within the next 1-3 weeks.  Please call us  at [phone] if you have not heard about the biopsies in 3 weeks.    SIGNATURES/CONFIDENTIALITY: You and/or your care partner have signed paperwork which will be entered into your electronic medical record.  These signatures attest to the fact that that the information above on your After Visit Summary has been reviewed and is understood.  Full responsibility of the confidentiality of this discharge information lies with you and/or your care-partner.

## 2024-02-17 NOTE — Progress Notes (Signed)
 Called to room to assist during endoscopic procedure.  Patient ID and intended procedure confirmed with present staff. Received instructions for my participation in the procedure from the performing physician.

## 2024-02-17 NOTE — Progress Notes (Signed)
 [facility] History and Physical   Primary Care Physician:  Jenelle Mis, FNP   Reason for Procedure:   Colon cancer screening  Plan:    Colonoscopy      HPI: Alexis Garrett is a 53 y.o. female  here for colonoscopy screening - first time exam. Patient denies any bowel symptoms at this time. No family history of colon cancer known. Otherwise feels well without any cardiopulmonary symptoms.   I have discussed risks / benefits of anesthesia and endoscopic procedure with [facility] [place] and they wish to proceed with the exams as outlined today.    Past Medical History:  Diagnosis Date   Hernia, umbilical    Tinnitus, bilateral     History reviewed. No pertinent surgical history.  Prior to Admission medications   Medication Sig Start Date End Date Taking? Authorizing Provider  Cholecalciferol  50 MCG (2000 UT) TABS Take 1 tablet (2,000 Units total) by mouth daily. 01/15/21  Yes Arcadio Knuckles, MD  Cyanocobalamin (B-12) 1000 MCG CAPS  06/07/23  Yes [provider]  Multiple Vitamins-Minerals (QC WOMENS DAILY MULTIVITAMIN) TABS  06/07/23  Yes [provider]  SUFLAVE  178.7 g SOLR Take by mouth once. 02/14/24  Yes [provider]    Current Outpatient Medications  Medication Sig Dispense Refill   Cholecalciferol  50 MCG (2000 UT) TABS Take 1 tablet (2,000 Units total) by mouth daily. 90 tablet 3   Cyanocobalamin (B-12) 1000 MCG CAPS      Multiple Vitamins-Minerals (QC WOMENS DAILY MULTIVITAMIN) TABS      SUFLAVE  178.7 g SOLR Take by mouth once.     Current Facility-Administered Medications  Medication Dose Route Frequency Provider Last Rate Last Admin   0.9 %  sodium chloride infusion  500 mL Intravenous Once Jannifer Fischler, Lendon Queen, MD        Allergies as of 02/17/2024 - Review Complete 02/17/2024  Allergen Reaction Noted   Biaxin [clarithromycin] Rash 09/03/2018   Clindamycin Rash 05/22/2019    Family History  Problem Relation Age of Onset    Diabetes Mother    Hypertension Mother    Kidney disease Mother    Hypertension Father    Hypertension Sister    Diabetes Brother    Thyroid  disease Brother    Colon cancer Neg Hx    Colon polyps Neg Hx    Esophageal cancer Neg Hx    Rectal cancer Neg Hx    Stomach cancer Neg Hx     Social History   Socioeconomic History   Marital status: Married    Spouse name: Not on file   Number of children: Not on file   Years of education: Not on file   Highest education level: Master's degree (e.g., MA, MS, MEng, MEd, MSW, MBA)  Occupational History   Not on file  Tobacco Use   Smoking status: Never   Smokeless tobacco: Never  Vaping Use   Vaping status: Never Used  Substance and Sexual Activity   Alcohol use: Yes    Alcohol/week: 4.0 standard drinks of alcohol    Types: 4 Glasses of wine per week   Drug use: Never   Sexual activity: Yes    Partners: Male    Birth control/protection: Post-menopausal  Other Topics Concern   Not on file  Social History Narrative   Not on file   Social Drivers of Health   Financial Resource Strain: Low Risk  (11/14/2023)   Overall Financial Resource Strain (CARDIA)  Difficulty of Paying Living Expenses: Not hard at all  Food Insecurity: No Food Insecurity (11/14/2023)   Hunger Vital Sign    Worried About Running Out of Food in the Last Year: Never true    Ran Out of Food in the Last Year: Never true  Transportation Needs: No Transportation Needs (11/14/2023)   PRAPARE - Administrator, Civil Service (Medical): No    Lack of Transportation (Non-Medical): No  Physical Activity: Insufficiently Active (11/14/2023)   Exercise Vital Sign    Days of Exercise per Week: 2 days    Minutes of Exercise per Session: 40 min  Stress: No Stress Concern Present (11/14/2023)   [employer] of Occupational Health - Occupational Stress Questionnaire    Feeling of Stress : Not at all  Social Connections: Moderately Integrated (11/14/2023)    Social Connection and Isolation Panel    Frequency of Communication with Friends and Family: More than three times a week    Frequency of Social Gatherings with Friends and Family: Twice a week    Attends Religious Services: More than 4 times per year    Active Member of [employer] or Organizations: No    Attends Engineer, structural: Not on file    Marital Status: Married  Catering manager Violence: Not on file    Review of Systems: All other review of systems negative except as mentioned in the HPI.  Physical Exam: Vital signs BP (!) 158/92   Pulse 76   Temp 98 F (36.7 C) (Temporal)   Ht 5' 6 (1.676 m)   Wt 159 lb (72.1 kg)   LMP 04/21/2023 (Exact Date)   SpO2 100%   BMI 25.66 kg/m   General:   Alert,  Well-developed, pleasant and cooperative in NAD Lungs:  Clear throughout to auscultation.   Heart:  Regular rate and rhythm Abdomen:  Soft, nontender and nondistended.   Neuro/Psych:  Alert and cooperative. Normal mood and affect. A and O x 3  Christi Coward, MD [facility]

## 2024-02-17 NOTE — Progress Notes (Signed)
 Pt's states no medical or surgical changes since previsit or office visit.

## 2024-02-17 NOTE — Progress Notes (Signed)
 Sedate, gd SR, tolerated procedure well, VSS, report to RN

## 2024-02-20 ENCOUNTER — Telehealth: Payer: Self-pay

## 2024-02-20 NOTE — Telephone Encounter (Signed)
  Follow up Call-     02/17/2024    1:19 PM  Call back number  Post procedure Call Back phone  # [phone]  Permission to leave phone message Yes     Attempted to call patient regarding follow-up. No answer, VM left.

## 2024-02-22 ENCOUNTER — Ambulatory Visit: Payer: Self-pay | Admitting: Gastroenterology

## 2024-02-22 LAB — SURGICAL PATHOLOGY

## 2024-05-29 ENCOUNTER — Encounter: Payer: Self-pay | Admitting: Family Medicine

## 2024-05-29 ENCOUNTER — Ambulatory Visit (INDEPENDENT_AMBULATORY_CARE_PROVIDER_SITE_OTHER): Admitting: Family Medicine

## 2024-05-29 VITALS — BP 124/85 | HR 73 | Temp 97.9°F | Ht 66.0 in | Wt 174.4 lb

## 2024-05-29 DIAGNOSIS — Z23 Encounter for immunization: Secondary | ICD-10-CM

## 2024-05-29 DIAGNOSIS — N951 Menopausal and female climacteric states: Secondary | ICD-10-CM | POA: Diagnosis not present

## 2024-05-29 NOTE — Progress Notes (Unsigned)
   Subjective:  HPI: Alexis Garrett is a 53 y.o. female presenting on 05/29/2024 for Medical Management of Chronic Issues (Discuss hot flashes and menopause,  hair falling out /Interested in flu vaccination today )   HPI Patient is in today for hot flashes and menopause symptoms for months to years. No menstrual cycles since August 2024. Hot flashes occur during the day and night. She is also losing hair within the last 6 weeks. No history of breast cancer, hypertriglyceridemia, risk factors for venous thromboembolic disease, active gallbladder disease, and/or migraine headache with aura. She does have an intact uterus.   ROS  Relevant past medical history reviewed and updated as indicated.   Past Medical History:  Diagnosis Date   Allergy    GERD (gastroesophageal reflux disease)    Occasionally   Hernia, umbilical    Hypertension    Tinnitus, bilateral      History reviewed. No pertinent surgical history.  Allergies and medications reviewed and updated.   Current Outpatient Medications:    Cholecalciferol  50 MCG (2000 UT) TABS, Take 1 tablet (2,000 Units total) by mouth daily., Disp: 90 tablet, Rfl: 3   Cyanocobalamin (B-12) 1000 MCG CAPS, , Disp: , Rfl:    Multiple Vitamins-Minerals (QC WOMENS DAILY MULTIVITAMIN) TABS, , Disp: , Rfl:   Allergies  Allergen Reactions   Biaxin [Clarithromycin] Rash   Clindamycin Rash    Objective:   BP 124/85   Pulse 73   Temp 97.9 F (36.6 C)   Ht 5' 6 (1.676 m)   Wt 174 lb 6.4 oz (79.1 kg)   LMP  (Approximate)   SpO2 97%   BMI 28.15 kg/m      05/29/2024    4:33 PM 05/29/2024    4:24 PM 02/17/2024    2:36 PM  Vitals with BMI  Height  5' 6   Weight  174 lbs 6 oz   BMI  28.16   Systolic 124 130 856  Diastolic 85 80 95  Pulse  73 72     Physical Exam  Assessment & Plan:  Need for vaccination -     Flu vaccine trivalent PF, 6mos and older(Flulaval,Afluria,Fluarix,Fluzone)     Follow up plan: No follow-ups on  file.  Jeoffrey GORMAN Barrio, FNP

## 2024-05-30 DIAGNOSIS — N951 Menopausal and female climacteric states: Secondary | ICD-10-CM | POA: Insufficient documentation

## 2024-05-30 MED ORDER — PROGESTERONE MICRONIZED 100 MG PO CAPS: 100.0000 mg | ORAL_CAPSULE | Freq: Every evening | ORAL | 1 refills | Status: AC

## 2024-05-30 MED ORDER — ESTRADIOL 0.0375 MG/24HR TD PTTW: 1.0000 | MEDICATED_PATCH | TRANSDERMAL | 12 refills | Status: AC

## 2024-05-30 NOTE — Assessment & Plan Note (Signed)
 Pt experiencing hot flashes due to menopause and would like to start hormonal therapy. Discussed risks vs benefits. Advised on short term use. Will start Vivelle  0.035mg /24hr patch two times weekly in addition to Prometrium  100mg  nightly due to her having an intact uterus. Will trial a taper in 3-5 years or sooner if desired. She should notify me of vaginal bleeding. Mammogram normal 12/09/2023.

## 2024-11-16 ENCOUNTER — Other Ambulatory Visit

## 2024-11-21 ENCOUNTER — Encounter: Admitting: Family Medicine
